# Patient Record
Sex: Female | Born: 1980 | Race: Black or African American | Hispanic: No | Marital: Single | Smoking: Never smoker
Health system: Southern US, Community
[De-identification: ages and names within clinical notes are randomized; demographics above are authoritative.]

## PROBLEM LIST (undated history)

## (undated) DIAGNOSIS — Z789 Other specified health status: Secondary | ICD-10-CM

## (undated) HISTORY — PX: MULTIPLE TOOTH EXTRACTIONS: SHX2053

## (undated) HISTORY — PX: CHOLECYSTECTOMY: SHX55

---

## 2018-12-07 DIAGNOSIS — Z113 Encounter for screening for infections with a predominantly sexual mode of transmission: Secondary | ICD-10-CM | POA: Diagnosis not present

## 2018-12-07 DIAGNOSIS — Z01419 Encounter for gynecological examination (general) (routine) without abnormal findings: Secondary | ICD-10-CM | POA: Diagnosis not present

## 2018-12-07 DIAGNOSIS — Z124 Encounter for screening for malignant neoplasm of cervix: Secondary | ICD-10-CM | POA: Diagnosis not present

## 2018-12-07 DIAGNOSIS — N898 Other specified noninflammatory disorders of vagina: Secondary | ICD-10-CM | POA: Diagnosis not present

## 2018-12-07 DIAGNOSIS — E669 Obesity, unspecified: Secondary | ICD-10-CM | POA: Diagnosis not present

## 2019-04-04 DIAGNOSIS — L219 Seborrheic dermatitis, unspecified: Secondary | ICD-10-CM | POA: Diagnosis not present

## 2019-05-29 DIAGNOSIS — M25572 Pain in left ankle and joints of left foot: Secondary | ICD-10-CM | POA: Diagnosis not present

## 2019-05-29 DIAGNOSIS — M79662 Pain in left lower leg: Secondary | ICD-10-CM | POA: Diagnosis not present

## 2019-06-11 DIAGNOSIS — K080 Exfoliation of teeth due to systemic causes: Secondary | ICD-10-CM | POA: Diagnosis not present

## 2019-07-25 DIAGNOSIS — Z01411 Encounter for gynecological examination (general) (routine) with abnormal findings: Secondary | ICD-10-CM | POA: Diagnosis not present

## 2019-07-25 DIAGNOSIS — N858 Other specified noninflammatory disorders of uterus: Secondary | ICD-10-CM | POA: Diagnosis not present

## 2019-08-26 DIAGNOSIS — N858 Other specified noninflammatory disorders of uterus: Secondary | ICD-10-CM | POA: Diagnosis not present

## 2019-08-26 DIAGNOSIS — D259 Leiomyoma of uterus, unspecified: Secondary | ICD-10-CM | POA: Diagnosis not present

## 2019-09-04 DIAGNOSIS — L700 Acne vulgaris: Secondary | ICD-10-CM | POA: Diagnosis not present

## 2019-09-04 DIAGNOSIS — L218 Other seborrheic dermatitis: Secondary | ICD-10-CM | POA: Diagnosis not present

## 2019-12-17 DIAGNOSIS — Z7189 Other specified counseling: Secondary | ICD-10-CM | POA: Diagnosis not present

## 2019-12-17 DIAGNOSIS — Z23 Encounter for immunization: Secondary | ICD-10-CM | POA: Diagnosis not present

## 2020-03-31 DIAGNOSIS — D259 Leiomyoma of uterus, unspecified: Secondary | ICD-10-CM | POA: Diagnosis not present

## 2020-04-06 DIAGNOSIS — Z789 Other specified health status: Secondary | ICD-10-CM | POA: Diagnosis not present

## 2020-04-06 DIAGNOSIS — D259 Leiomyoma of uterus, unspecified: Secondary | ICD-10-CM | POA: Diagnosis not present

## 2020-05-14 DIAGNOSIS — L658 Other specified nonscarring hair loss: Secondary | ICD-10-CM | POA: Diagnosis not present

## 2020-05-14 DIAGNOSIS — L219 Seborrheic dermatitis, unspecified: Secondary | ICD-10-CM | POA: Diagnosis not present

## 2020-06-23 DIAGNOSIS — R0789 Other chest pain: Secondary | ICD-10-CM | POA: Diagnosis not present

## 2020-07-02 DIAGNOSIS — L910 Hypertrophic scar: Secondary | ICD-10-CM | POA: Diagnosis not present

## 2020-07-02 DIAGNOSIS — L700 Acne vulgaris: Secondary | ICD-10-CM | POA: Diagnosis not present

## 2020-07-09 ENCOUNTER — Other Ambulatory Visit: Payer: Self-pay

## 2020-07-09 ENCOUNTER — Ambulatory Visit
Admission: RE | Admit: 2020-07-09 | Discharge: 2020-07-09 | Disposition: A | Discharge status: Home / Self Care | Payer: Federal, State, Local not specified - PPO | Source: Ambulatory Visit | Attending: Family Medicine | Admitting: Family Medicine

## 2020-07-09 ENCOUNTER — Other Ambulatory Visit: Payer: Self-pay | Admitting: Family Medicine

## 2020-07-09 DIAGNOSIS — R0789 Other chest pain: Secondary | ICD-10-CM

## 2020-07-15 NOTE — Pre-Procedure Instructions (Signed)
Surgical Instructions:    Your procedure is scheduled on Monday, May 23rd.  Report to [facility] Main Entrance "A" at 06:00 A.M., then check in with the Admitting office.  Call this number if you have any questions prior to, or have any problems the morning of surgery:  [phone]    Remember:  Do not eat after midnight the night before your surgery.  You may drink clear liquids until 05:00 AM the morning of your surgery.   Clear liquids allowed are: Water, Non-Citrus Juices (without pulp), Carbonated Beverages, Clear Tea, Black Coffee Only, and Gatorade.    Take these medicines the morning of surgery with A SIP OF WATER: NONE    As of today, STOP taking any Aspirin (unless otherwise instructed by your surgeon) Aleve, Naproxen, Ibuprofen, Motrin, Advil, Goody's, BC's, all herbal medications, fish oil, and all vitamins.              Special instructions:   [place]- Preparing For Surgery  Before surgery, you can play an important role. Because skin is not sterile, your skin needs to be as free of germs as possible. You can reduce the number of germs on your skin by washing with CHG (chlorahexidine gluconate) Soap before surgery.  CHG is an antiseptic cleaner which kills germs and bonds with the skin to continue killing germs even after washing.    Oral Hygiene is also important to reduce your risk of infection.  Remember - BRUSH YOUR TEETH THE MORNING OF SURGERY WITH YOUR REGULAR TOOTHPASTE  Please do not use if you have an allergy to CHG or antibacterial soaps. If your skin becomes reddened/irritated stop using the CHG.  Do not shave (including legs and underarms) for at least 48 hours prior to first CHG shower. It is OK to shave your face.  Please follow these instructions carefully.   1. Shower the NIGHT BEFORE SURGERY and the MORNING OF SURGERY  2. If you chose to wash your hair, wash your hair first as usual with your normal shampoo.  3. After you shampoo, rinse your  hair and body thoroughly to remove the shampoo.  4. Wash Face and genitals (private parts) with your normal soap.   5. Use CHG Soap as you would any other liquid soap. You can apply CHG directly to the skin and wash gently with a scrungie or a clean washcloth.   6. Apply the CHG Soap to your body ONLY FROM THE NECK DOWN.  Do not use on open wounds or open sores. Avoid contact with your eyes, ears, mouth and genitals (private parts). Wash Face and genitals (private parts)  with your normal soap.   7. Wash thoroughly, paying special attention to the area where your surgery will be performed.  8. Thoroughly rinse your body with warm water from the neck down.  9. DO NOT shower/wash with your normal soap after using and rinsing off the CHG Soap.  10. Pat yourself dry with a CLEAN TOWEL.  11. Wear CLEAN PAJAMAS to bed the night before surgery.  12. Place CLEAN SHEETS on your bed the night before your surgery.  13. DO NOT SLEEP WITH PETS.   Day of Surgery: SHOWER with CHG soap. Brush your teeth WITH YOUR REGULAR TOOTHPASTE. Wear Clean/Comfortable clothing the morning of surgery. Do not apply any deodorants/lotions.   Do not wear jewelry, make up, or nail polish. Do not shave 48 hours prior to surgery.   Do not bring valuables to the hospital. [facility] is not responsible for any belongings or valuables.  Do NOT Smoke (Tobacco/Vaping) or drink Alcohol 24 hours prior to your procedure.   If you use a CPAP at night, you may bring all equipment for your overnight stay.   Contacts, glasses, or dentures may not be worn into surgery, please bring cases for these belongings.   For patients admitted to the hospital, discharge time will be determined by your treatment team.  Patients discharged the day of surgery will not be allowed to drive home, and someone needs to stay with them for 24 hours.    Please read over the following fact sheets that you were given.

## 2020-07-16 ENCOUNTER — Other Ambulatory Visit (HOSPITAL_COMMUNITY)
Admission: RE | Admit: 2020-07-16 | Discharge: 2020-07-16 | Disposition: A | Discharge status: Home / Self Care | Payer: Federal, State, Local not specified - PPO | Source: Ambulatory Visit | Attending: Obstetrics and Gynecology | Admitting: Obstetrics and Gynecology

## 2020-07-16 ENCOUNTER — Encounter (HOSPITAL_COMMUNITY): Payer: Self-pay

## 2020-07-16 ENCOUNTER — Inpatient Hospital Stay (HOSPITAL_COMMUNITY)
Admission: RE | Admit: 2020-07-16 | Discharge: 2020-07-16 | Disposition: A | Discharge status: Home / Self Care | Payer: Self-pay | Source: Ambulatory Visit

## 2020-07-16 ENCOUNTER — Other Ambulatory Visit: Payer: Self-pay

## 2020-07-16 DIAGNOSIS — Z20822 Contact with and (suspected) exposure to covid-19: Secondary | ICD-10-CM | POA: Diagnosis not present

## 2020-07-16 DIAGNOSIS — Z01812 Encounter for preprocedural laboratory examination: Secondary | ICD-10-CM | POA: Insufficient documentation

## 2020-07-16 HISTORY — DX: Other specified health status: Z78.9

## 2020-07-16 LAB — SARS CORONAVIRUS 2 (TAT 6-24 HRS): SARS Coronavirus 2: NEGATIVE

## 2020-07-16 NOTE — Progress Notes (Addendum)
  SDW call completed because patient missed PAT appt today.  Verbal instructions given to patient.  [facility] will arrive 2.5 hours early to get her lab work completed before procedure.   PCP - denies Cardiologist - denies  Chest x-ray - denies EKG - denies Stress Test - denies ECHO - denies Cardiac Cath - denies  COVID TEST- pending   Anesthesia review: NO  Patient denies shortness of breath, fever, cough and chest pain at PAT appointment   All instructions explained to the patient, with a verbal understanding of the material. Patient agrees to go over the instructions while at home for a better understanding. Patient also instructed to self quarantine after being tested for COVID-19. The opportunity to ask questions was provided.

## 2020-07-16 NOTE — Pre-Procedure Instructions (Signed)
Surgical Instructions:    Your procedure is scheduled on Monday, May 23rd.  Report to [facility] Main Entrance "A" at 06:00 A.M., then check in with the Admitting office.  Call this number if you have any questions prior to, or have any problems the morning of surgery:  [phone]    Remember:  Do not eat or drink after midnight the night before your surgery.     Take these medicines the morning of surgery with A SIP OF WATER: NONE  As of today, STOP taking any Aspirin (unless otherwise instructed by your surgeon) Aleve, Naproxen, Ibuprofen, Motrin, Advil, Goody's, BC's, all herbal medications, fish oil, and all vitamins.              Special instructions:   [place]- Preparing For Surgery  Before surgery, you can play an important role. Because skin is not sterile, your skin needs to be as free of germs as possible. You can reduce the number of germs on your skin by washing with CHG (chlorahexidine gluconate) Soap before surgery.  CHG is an antiseptic cleaner which kills germs and bonds with the skin to continue killing germs even after washing.    Oral Hygiene is also important to reduce your risk of infection.  Remember - BRUSH YOUR TEETH THE MORNING OF SURGERY WITH YOUR REGULAR TOOTHPASTE  Please do not use if you have an allergy to CHG or antibacterial soaps. If your skin becomes reddened/irritated stop using the CHG.  Do not shave (including legs and underarms) for at least 48 hours prior to first CHG shower. It is OK to shave your face.  Please follow these instructions carefully.   1. Shower the NIGHT BEFORE SURGERY and the MORNING OF SURGERY  2. If you chose to wash your hair, wash your hair first as usual with your normal shampoo.  3. After you shampoo, rinse your hair and body thoroughly to remove the shampoo.  4. Wash Face and genitals (private parts) with your normal soap.   5. Use CHG Soap as you would any other liquid soap. You can apply CHG directly to  the skin and wash gently with a scrungie or a clean washcloth.   6. Apply the CHG Soap to your body ONLY FROM THE NECK DOWN.  Do not use on open wounds or open sores. Avoid contact with your eyes, ears, mouth and genitals (private parts). Wash Face and genitals (private parts)  with your normal soap.   7. Wash thoroughly, paying special attention to the area where your surgery will be performed.  8. Thoroughly rinse your body with warm water from the neck down.  9. DO NOT shower/wash with your normal soap after using and rinsing off the CHG Soap.  10. Pat yourself dry with a CLEAN TOWEL.  11. Wear CLEAN PAJAMAS to bed the night before surgery.  12. Place CLEAN SHEETS on your bed the night before your surgery.  13. DO NOT SLEEP WITH PETS.   Day of Surgery: SHOWER with CHG soap. Brush your teeth WITH YOUR REGULAR TOOTHPASTE. Wear Clean/Comfortable clothing the morning of surgery. Do not apply any deodorants/lotions.   Do not wear jewelry, make up, or nail polish. Do not shave 48 hours prior to surgery.   Do not bring valuables to the hospital. [facility] is not responsible for any belongings or valuables.  Do NOT Smoke (Tobacco/Vaping) or drink Alcohol 24 hours prior to your procedure.   If you use a CPAP at night, you  may bring all equipment for your overnight stay.   Contacts, glasses, or dentures may not be worn into surgery, please bring cases for these belongings.   For patients admitted to the hospital, discharge time will be determined by your treatment team.  Patients discharged the day of surgery will not be allowed to drive home, and someone needs to stay with them for 24 hours.    Please read over the following fact sheets that you were given.

## 2020-07-18 ENCOUNTER — Encounter (HOSPITAL_COMMUNITY): Payer: Self-pay | Admitting: Obstetrics and Gynecology

## 2020-07-18 NOTE — H&P (Signed)
Felicia Glass is an 40 y.o. female presenting for scheduled surgery. Still desires future fertility  Pertinent Gynecological History: Menses: flow is excessive with use of 6-7 pads or tampons on heaviest days Bleeding: dysfunctional uterine bleeding Contraception: abstinence DES exposure: denies Blood transfusions: none Sexually transmitted diseases: no past history Previous GYN Procedures: none  Last mammogram:too young Last pap: normal Date: 12/07/2018 OB History: G1, P1001 (2008 SVD x1, [facility])   Menstrual History: Menarche age: early teens Patient's last menstrual period was 07/16/2020.    Past Medical History:  Diagnosis Date  . Medical history non-contributory     Past Surgical History:  Procedure Laterality Date  . CHOLECYSTECTOMY    . MULTIPLE TOOTH EXTRACTIONS      History reviewed. No pertinent family history.  Social History:  reports that she has never smoked. She has never used smokeless tobacco. She reports current alcohol use. She reports previous drug use. Drug: Marijuana.  Allergies: No Known Allergies  No medications prior to admission.    Review of Systems  Constitutional: Negative for chills and fever.  Respiratory: Negative for shortness of breath.   Cardiovascular: Negative for chest pain, palpitations and leg swelling.  Gastrointestinal: Positive for abdominal distention. Negative for abdominal pain, nausea and vomiting.  Genitourinary: Positive for frequency and pelvic pain.  Neurological: Negative for dizziness, weakness and headaches.  Psychiatric/Behavioral: Negative for suicidal ideas.    Last menstrual period 07/16/2020. Physical Exam Gen NAD CV: CTAB, RRR Abd: Inspection/Palpation/Auscultation: no rebound or guarding and non-distended, soft, tenderness, normal bowel sounds, abdomen: incision laparoscopy, and uterus palpated 20 weeks; LLQ, TTP deep, palpable fundus vs fibroid Lap chole incision healed well. MSK: neg calf  edema/Homans BL Psych/Neuro: WNL  No results found for this or any previous visit (from the past 24 hour(s)).  No results found.  Assessment/Plan: This is a 40yo G1P1001, currently abstinent, who presents for surgical management of her AUB-HMB-L and preservation of fertility via abdominal myomectomy and cystoscopy. Risks of procedure (abd myomectomy/cysto) include infection of the uterus, pelvic organs, or skin, inadvertent injury to internal organs, such as bowel or bladder. If there is major injury, extensive surgery may be required. If injury is minor, it may be treated with relative ease. Discussed possibility of excessive blood loss and transfusion. Patient accepts the possibility of blood transfusion, if necessary. Bowel and/or bladder injury may require prolonged inpatient stay and possible colostomy, Foley catheter, etc, as deemed fit by other surgeon. Patient understands and agrees to move forward  Felicia Glass 07/18/2020, 4:44 PM

## 2020-07-20 ENCOUNTER — Inpatient Hospital Stay (HOSPITAL_COMMUNITY): Payer: Federal, State, Local not specified - PPO | Admitting: Anesthesiology

## 2020-07-20 ENCOUNTER — Inpatient Hospital Stay (HOSPITAL_COMMUNITY)
Admission: RE | Admit: 2020-07-20 | Discharge: 2020-07-22 | DRG: 743 | Disposition: A | Discharge status: Home / Self Care | Payer: Federal, State, Local not specified - PPO | Attending: Obstetrics and Gynecology | Admitting: Obstetrics and Gynecology

## 2020-07-20 ENCOUNTER — Encounter (HOSPITAL_COMMUNITY): Payer: Self-pay | Admitting: Obstetrics and Gynecology

## 2020-07-20 ENCOUNTER — Encounter (HOSPITAL_COMMUNITY)
Admission: RE | Disposition: A | Discharge status: Home / Self Care | Payer: Self-pay | Source: Home / Self Care | Attending: Obstetrics and Gynecology

## 2020-07-20 DIAGNOSIS — R350 Frequency of micturition: Secondary | ICD-10-CM | POA: Diagnosis present

## 2020-07-20 DIAGNOSIS — D252 Subserosal leiomyoma of uterus: Principal | ICD-10-CM | POA: Diagnosis present

## 2020-07-20 DIAGNOSIS — Z9049 Acquired absence of other specified parts of digestive tract: Secondary | ICD-10-CM

## 2020-07-20 DIAGNOSIS — R12 Heartburn: Secondary | ICD-10-CM | POA: Diagnosis not present

## 2020-07-20 DIAGNOSIS — R3915 Urgency of urination: Secondary | ICD-10-CM | POA: Diagnosis present

## 2020-07-20 DIAGNOSIS — N946 Dysmenorrhea, unspecified: Secondary | ICD-10-CM | POA: Diagnosis not present

## 2020-07-20 DIAGNOSIS — D251 Intramural leiomyoma of uterus: Secondary | ICD-10-CM | POA: Diagnosis not present

## 2020-07-20 DIAGNOSIS — D259 Leiomyoma of uterus, unspecified: Secondary | ICD-10-CM | POA: Diagnosis present

## 2020-07-20 DIAGNOSIS — Z3169 Encounter for other general counseling and advice on procreation: Secondary | ICD-10-CM | POA: Diagnosis not present

## 2020-07-20 HISTORY — PX: MYOMECTOMY: SHX85

## 2020-07-20 LAB — CBC WITH DIFFERENTIAL/PLATELET
Abs Immature Granulocytes: 0.01 10*3/uL (ref 0.00–0.07)
Basophils Absolute: 0 10*3/uL (ref 0.0–0.1)
Basophils Relative: 1 %
Eosinophils Absolute: 0.2 10*3/uL (ref 0.0–0.5)
Eosinophils Relative: 4 %
HCT: 32.7 % — ABNORMAL LOW (ref 36.0–46.0)
Hemoglobin: 10.1 g/dL — ABNORMAL LOW (ref 12.0–15.0)
Immature Granulocytes: 0 %
Lymphocytes Relative: 49 %
Lymphs Abs: 1.9 10*3/uL (ref 0.7–4.0)
MCH: 29 pg (ref 26.0–34.0)
MCHC: 30.9 g/dL (ref 30.0–36.0)
MCV: 94 fL (ref 80.0–100.0)
Monocytes Absolute: 0.5 10*3/uL (ref 0.1–1.0)
Monocytes Relative: 12 %
Neutro Abs: 1.4 10*3/uL — ABNORMAL LOW (ref 1.7–7.7)
Neutrophils Relative %: 34 %
Platelets: 175 10*3/uL (ref 150–400)
RBC: 3.48 MIL/uL — ABNORMAL LOW (ref 3.87–5.11)
RDW: 16.4 % — ABNORMAL HIGH (ref 11.5–15.5)
WBC: 4 10*3/uL (ref 4.0–10.5)
nRBC: 0 % (ref 0.0–0.2)

## 2020-07-20 LAB — RPR: RPR Ser Ql: NONREACTIVE

## 2020-07-20 LAB — PREPARE RBC (CROSSMATCH)

## 2020-07-20 LAB — POCT PREGNANCY, URINE: Preg Test, Ur: NEGATIVE

## 2020-07-20 LAB — ABO/RH: ABO/RH(D): O POS

## 2020-07-20 SURGERY — MYOMECTOMY, ABDOMINAL APPROACH
Anesthesia: General | Site: Abdomen

## 2020-07-20 MED ORDER — ROCURONIUM BROMIDE 10 MG/ML (PF) SYRINGE
PREFILLED_SYRINGE | INTRAVENOUS | Status: AC
  Filled 2020-07-20: qty 10

## 2020-07-20 MED ORDER — ONDANSETRON HCL 4 MG/2ML IJ SOLN: 4.0000 mg | Freq: Once | INTRAMUSCULAR | Status: DC | PRN

## 2020-07-20 MED ORDER — ONDANSETRON HCL 4 MG PO TABS: 4.0000 mg | ORAL_TABLET | Freq: Four times a day (QID) | ORAL | Status: DC | PRN

## 2020-07-20 MED ORDER — IBUPROFEN 600 MG PO TABS
600.0000 mg | ORAL_TABLET | Freq: Four times a day (QID) | ORAL | Status: DC
  Administered 2020-07-21 – 2020-07-22 (×4): 600 mg via ORAL
  Filled 2020-07-20 (×5): qty 1

## 2020-07-20 MED ORDER — MIDAZOLAM HCL 5 MG/5ML IJ SOLN
INTRAMUSCULAR | Status: DC | PRN
  Administered 2020-07-20: 2 mg via INTRAVENOUS

## 2020-07-20 MED ORDER — LACTATED RINGERS IV SOLN: INTRAVENOUS | Status: DC

## 2020-07-20 MED ORDER — SODIUM CHLORIDE 0.9 % IR SOLN: Status: DC | PRN

## 2020-07-20 MED ORDER — LIDOCAINE 2% (20 MG/ML) 5 ML SYRINGE
INTRAMUSCULAR | Status: AC
  Filled 2020-07-20: qty 5

## 2020-07-20 MED ORDER — DOCUSATE SODIUM 100 MG PO CAPS
100.0000 mg | ORAL_CAPSULE | Freq: Two times a day (BID) | ORAL | Status: DC
  Administered 2020-07-20 – 2020-07-22 (×5): 100 mg via ORAL
  Filled 2020-07-20 (×5): qty 1

## 2020-07-20 MED ORDER — FENTANYL CITRATE (PF) 100 MCG/2ML IJ SOLN
25.0000 ug | INTRAMUSCULAR | Status: DC | PRN
  Administered 2020-07-20: 50 ug via INTRAVENOUS

## 2020-07-20 MED ORDER — OXYCODONE HCL 5 MG/5ML PO SOLN: 5.0000 mg | Freq: Once | ORAL | Status: DC | PRN

## 2020-07-20 MED ORDER — OXYCODONE HCL 5 MG PO TABS: 5.0000 mg | ORAL_TABLET | Freq: Once | ORAL | Status: DC | PRN

## 2020-07-20 MED ORDER — PROPOFOL 10 MG/ML IV BOLUS
INTRAVENOUS | Status: DC | PRN
  Administered 2020-07-20: 150 mg via INTRAVENOUS

## 2020-07-20 MED ORDER — SODIUM CHLORIDE (PF) 0.9 % IJ SOLN
INTRAMUSCULAR | Status: AC
  Filled 2020-07-20: qty 100

## 2020-07-20 MED ORDER — VASOPRESSIN 20 UNIT/ML IV SOLN
INTRAVENOUS | Status: DC | PRN
  Administered 2020-07-20: 13 mL via INTRAMUSCULAR

## 2020-07-20 MED ORDER — ORAL CARE MOUTH RINSE: 15.0000 mL | Freq: Once | OROMUCOSAL | Status: DC

## 2020-07-20 MED ORDER — FENTANYL CITRATE (PF) 100 MCG/2ML IJ SOLN
INTRAMUSCULAR | Status: AC
  Filled 2020-07-20: qty 2

## 2020-07-20 MED ORDER — POLYETHYLENE GLYCOL 3350 17 G PO PACK: 17.0000 g | PACK | Freq: Every day | ORAL | Status: DC | PRN

## 2020-07-20 MED ORDER — LIDOCAINE 2% (20 MG/ML) 5 ML SYRINGE
INTRAMUSCULAR | Status: DC | PRN
  Administered 2020-07-20: 60 mg via INTRAVENOUS

## 2020-07-20 MED ORDER — CHLORHEXIDINE GLUCONATE 0.12 % MT SOLN
15.0000 mL | Freq: Once | OROMUCOSAL | Status: DC
  Filled 2020-07-20: qty 15

## 2020-07-20 MED ORDER — PHENYLEPHRINE 40 MCG/ML (10ML) SYRINGE FOR IV PUSH (FOR BLOOD PRESSURE SUPPORT)
PREFILLED_SYRINGE | INTRAVENOUS | Status: AC
  Filled 2020-07-20: qty 10

## 2020-07-20 MED ORDER — BUPIVACAINE HCL (PF) 0.25 % IJ SOLN
INTRAMUSCULAR | Status: AC
  Filled 2020-07-20: qty 30

## 2020-07-20 MED ORDER — 0.9 % SODIUM CHLORIDE (POUR BTL) OPTIME
TOPICAL | Status: DC | PRN
  Administered 2020-07-20: 2000 mL

## 2020-07-20 MED ORDER — FENTANYL CITRATE (PF) 100 MCG/2ML IJ SOLN
INTRAMUSCULAR | Status: DC | PRN
  Administered 2020-07-20: 100 ug via INTRAVENOUS

## 2020-07-20 MED ORDER — ONDANSETRON HCL 4 MG/2ML IJ SOLN: 4.0000 mg | Freq: Four times a day (QID) | INTRAMUSCULAR | Status: DC | PRN

## 2020-07-20 MED ORDER — PROPOFOL 10 MG/ML IV BOLUS
INTRAVENOUS | Status: AC
  Filled 2020-07-20: qty 40

## 2020-07-20 MED ORDER — SODIUM CHLORIDE 0.9% IV SOLUTION: Freq: Once | INTRAVENOUS | Status: DC

## 2020-07-20 MED ORDER — NON FORMULARY
Status: DC | PRN
  Administered 2020-07-20: 1 mL

## 2020-07-20 MED ORDER — ONDANSETRON HCL 4 MG/2ML IJ SOLN
INTRAMUSCULAR | Status: DC | PRN
  Administered 2020-07-20: 4 mg via INTRAVENOUS

## 2020-07-20 MED ORDER — ACETAMINOPHEN 500 MG PO TABS
1000.0000 mg | ORAL_TABLET | Freq: Four times a day (QID) | ORAL | Status: DC
  Administered 2020-07-20 – 2020-07-22 (×8): 1000 mg via ORAL
  Filled 2020-07-20 (×8): qty 2

## 2020-07-20 MED ORDER — CEFAZOLIN SODIUM-DEXTROSE 2-4 GM/100ML-% IV SOLN
2.0000 g | INTRAVENOUS | Status: AC
  Administered 2020-07-20: 2 g via INTRAVENOUS
  Filled 2020-07-20: qty 100

## 2020-07-20 MED ORDER — DEXAMETHASONE SODIUM PHOSPHATE 10 MG/ML IJ SOLN
INTRAMUSCULAR | Status: AC
  Filled 2020-07-20: qty 1

## 2020-07-20 MED ORDER — SUGAMMADEX SODIUM 200 MG/2ML IV SOLN
INTRAVENOUS | Status: DC | PRN
  Administered 2020-07-20: 200 mg via INTRAVENOUS

## 2020-07-20 MED ORDER — MIDAZOLAM HCL 2 MG/2ML IJ SOLN
INTRAMUSCULAR | Status: AC
  Filled 2020-07-20: qty 2

## 2020-07-20 MED ORDER — VASOPRESSIN 20 UNIT/ML IV SOLN
INTRAVENOUS | Status: AC
  Filled 2020-07-20: qty 1

## 2020-07-20 MED ORDER — KETOROLAC TROMETHAMINE 30 MG/ML IJ SOLN
30.0000 mg | Freq: Four times a day (QID) | INTRAMUSCULAR | Status: AC
  Administered 2020-07-20 – 2020-07-21 (×4): 30 mg via INTRAVENOUS
  Filled 2020-07-20 (×4): qty 1

## 2020-07-20 MED ORDER — BUPIVACAINE HCL 0.25 % IJ SOLN
INTRAMUSCULAR | Status: DC | PRN
  Administered 2020-07-20: 10 mL

## 2020-07-20 MED ORDER — ONDANSETRON HCL 4 MG/2ML IJ SOLN
INTRAMUSCULAR | Status: AC
  Filled 2020-07-20: qty 2

## 2020-07-20 MED ORDER — PHENYLEPHRINE 40 MCG/ML (10ML) SYRINGE FOR IV PUSH (FOR BLOOD PRESSURE SUPPORT)
PREFILLED_SYRINGE | INTRAVENOUS | Status: DC | PRN
  Administered 2020-07-20 (×2): 80 ug via INTRAVENOUS

## 2020-07-20 MED ORDER — OXYCODONE HCL 5 MG PO TABS
5.0000 mg | ORAL_TABLET | ORAL | Status: DC | PRN
  Administered 2020-07-20 – 2020-07-22 (×4): 10 mg via ORAL
  Filled 2020-07-20 (×4): qty 2

## 2020-07-20 MED ORDER — ACETAMINOPHEN 500 MG PO TABS
1000.0000 mg | ORAL_TABLET | ORAL | Status: AC
  Administered 2020-07-20: 1000 mg via ORAL
  Filled 2020-07-20: qty 2

## 2020-07-20 MED ORDER — SIMETHICONE 80 MG PO CHEW
80.0000 mg | CHEWABLE_TABLET | Freq: Four times a day (QID) | ORAL | Status: DC | PRN
  Administered 2020-07-20 – 2020-07-22 (×2): 80 mg via ORAL
  Filled 2020-07-20 (×2): qty 1

## 2020-07-20 MED ORDER — POVIDONE-IODINE 10 % EX SWAB: 2.0000 "application " | Freq: Once | CUTANEOUS | Status: DC

## 2020-07-20 MED ORDER — DEXAMETHASONE SODIUM PHOSPHATE 4 MG/ML IJ SOLN
INTRAMUSCULAR | Status: DC | PRN
  Administered 2020-07-20: 10 mg via INTRAVENOUS

## 2020-07-20 MED ORDER — ROCURONIUM BROMIDE 10 MG/ML (PF) SYRINGE
PREFILLED_SYRINGE | INTRAVENOUS | Status: DC | PRN
  Administered 2020-07-20: 10 mg via INTRAVENOUS
  Administered 2020-07-20: 60 mg via INTRAVENOUS

## 2020-07-20 MED ORDER — FENTANYL CITRATE (PF) 250 MCG/5ML IJ SOLN
INTRAMUSCULAR | Status: AC
  Filled 2020-07-20: qty 5

## 2020-07-20 SURGICAL SUPPLY — 47 items
BARRIER ADHS 3X4 INTERCEED (GAUZE/BANDAGES/DRESSINGS) ×3 IMPLANT
CANISTER SUCT 3000ML PPV (MISCELLANEOUS) ×3 IMPLANT
COVER WAND RF STERILE (DRAPES) ×3 IMPLANT
DECANTER SPIKE VIAL GLASS SM (MISCELLANEOUS) ×3 IMPLANT
DRAPE CESAREAN BIRTH W POUCH (DRAPES) IMPLANT
DRSG OPSITE POSTOP 4X10 (GAUZE/BANDAGES/DRESSINGS) ×3 IMPLANT
DURAPREP 26ML APPLICATOR (WOUND CARE) ×3 IMPLANT
FILTER STRAW FLUID ASPIR (MISCELLANEOUS) IMPLANT
GAUZE 4X4 16PLY RFD (DISPOSABLE) ×3 IMPLANT
GLOVE BIO SURGEON STRL SZ 6 (GLOVE) ×3 IMPLANT
GLOVE SURG UNDER POLY LF SZ6.5 (GLOVE) ×3 IMPLANT
GLOVE SURG UNDER POLY LF SZ7 (GLOVE) ×3 IMPLANT
GOWN STRL REUS W/ TWL LRG LVL3 (GOWN DISPOSABLE) ×6 IMPLANT
GOWN STRL REUS W/TWL LRG LVL3 (GOWN DISPOSABLE) ×3
KIT TURNOVER KIT B (KITS) ×3 IMPLANT
MANIPULATOR UTERINE 7CM CLEARV (MISCELLANEOUS) IMPLANT
NEEDLE HYPO 22GX1.5 SAFETY (NEEDLE) ×3 IMPLANT
NEEDLE SPNL 22GX3.5 QUINCKE BK (NEEDLE) ×3 IMPLANT
NS IRRIG 1000ML POUR BTL (IV SOLUTION) ×3 IMPLANT
PACK ABDOMINAL GYN (CUSTOM PROCEDURE TRAY) ×3 IMPLANT
PAD OB MATERNITY 4.3X12.25 (PERSONAL CARE ITEMS) ×3 IMPLANT
PENCIL SMOKE EVACUATOR (MISCELLANEOUS) ×3 IMPLANT
RTRCTR C-SECT PINK 25CM LRG (MISCELLANEOUS) ×3 IMPLANT
SEPRAFILM MEMBRANE 5X6 (MISCELLANEOUS) IMPLANT
SET CYSTO W/LG BORE CLAMP LF (SET/KITS/TRAYS/PACK) ×3 IMPLANT
SHEET LAVH (DRAPES) IMPLANT
SPONGE LAP 18X18 RF (DISPOSABLE) ×3 IMPLANT
SURGILUBE 2OZ TUBE FLIPTOP (MISCELLANEOUS) ×3 IMPLANT
SUT MON AB 2-0 SH 27 (SUTURE) ×2
SUT MON AB 2-0 SH27 (SUTURE) ×4 IMPLANT
SUT PDS AB 0 CTX 60 (SUTURE) ×3 IMPLANT
SUT PLAIN 2 0 (SUTURE)
SUT PLAIN 2 0 XLH (SUTURE) IMPLANT
SUT PLAIN ABS 2-0 CT1 27XMFL (SUTURE) IMPLANT
SUT VIC AB 0 CT1 18XCR BRD8 (SUTURE) ×2 IMPLANT
SUT VIC AB 0 CT1 27 (SUTURE) ×2
SUT VIC AB 0 CT1 27XBRD ANBCTR (SUTURE) ×4 IMPLANT
SUT VIC AB 0 CT1 8-18 (SUTURE) ×1
SUT VIC AB 2-0 CT1 27 (SUTURE) ×1
SUT VIC AB 2-0 CT1 TAPERPNT 27 (SUTURE) ×2 IMPLANT
SUT VIC AB 4-0 KS 27 (SUTURE) ×3 IMPLANT
SUT VIC AB 4-0 SH 27 (SUTURE)
SUT VIC AB 4-0 SH 27XBRD (SUTURE) IMPLANT
SYR 3ML LL SCALE MARK (SYRINGE) ×3 IMPLANT
SYR CONTROL 10ML LL (SYRINGE) ×3 IMPLANT
TOWEL GREEN STERILE FF (TOWEL DISPOSABLE) ×6 IMPLANT
TRAY FOLEY W/BAG SLVR 14FR (SET/KITS/TRAYS/PACK) ×3 IMPLANT

## 2020-07-20 NOTE — Interval H&P Note (Signed)
History and Physical Interval Note:  07/20/2020 7:51 AM  Felicia Glass  has presented today for surgery, with the diagnosis of symptomatic uterine fibriods.  The various methods of treatment have been discussed with the patient and family. After consideration of risks, benefits and other options for treatment, the patient has consented to  Procedure(s): ABDOMINAL MYOMECTOMY (N/A) CYSTOSCOPY (N/A) as a surgical intervention.  The patient's history has been reviewed, patient examined, no change in status, stable for surgery.  I have reviewed the patient's chart and labs.  Questions were answered to the patient's satisfaction.    Patient is ready to proceed with procedure as stated. Is currently on cycle. Reviewed postoperative expectations and goals of procedure - removal of fibroids, maintaining future fertility. Is amenable to hysterectomy if unable to control bleeding and is necessary to save life. All questions answered.   Abdominal exam unchanged since preoperative visit  [place]

## 2020-07-20 NOTE — Plan of Care (Signed)
  Problem: Education: Goal: Knowledge of General Education information will improve Description Including pain rating scale, medication(s)/side effects and non-pharmacologic comfort measures Outcome: Progressing   

## 2020-07-20 NOTE — Anesthesia Procedure Notes (Signed)
Procedure Name: Intubation Date/Time: 07/20/2020 8:11 AM Performed by: Lieutenant Diego, CRNA Pre-anesthesia Checklist: Patient identified, Emergency Drugs available, Suction available and Patient being monitored Patient Re-evaluated:Patient Re-evaluated prior to induction Oxygen Delivery Method: Circle system utilized Preoxygenation: Pre-oxygenation with 100% oxygen Induction Type: IV induction Ventilation: Mask ventilation without difficulty Laryngoscope Size: 3 Grade View: Grade I Tube type: Oral Tube size: 7.0 mm Number of attempts: 1 Airway Equipment and Method: Stylet and Oral airway Placement Confirmation: ETT inserted through vocal cords under direct vision,  positive ETCO2 and breath sounds checked- equal and bilateral Secured at: 21 cm Tube secured with: Tape Dental Injury: Teeth and Oropharynx as per pre-operative assessment

## 2020-07-20 NOTE — Anesthesia Postprocedure Evaluation (Signed)
Anesthesia Post Note  Patient: Felicia Glass  Procedure(s) Performed: ABDOMINAL MYOMECTOMY (N/A Abdomen)     Patient location during evaluation: PACU Anesthesia Type: General Level of consciousness: awake and alert Pain management: pain level controlled Vital Signs Assessment: post-procedure vital signs reviewed and stable Respiratory status: spontaneous breathing, nonlabored ventilation, respiratory function stable and patient connected to nasal cannula oxygen Cardiovascular status: blood pressure returned to baseline and stable Postop Assessment: no apparent nausea or vomiting Anesthetic complications: no   No complications documented.  Last Vitals:  Vitals:   07/20/20 1142 07/20/20 1616  BP: 106/82 119/67  Pulse: 71 80  Resp: 15 16  Temp: 36.9 C 37.1 C  SpO2: 100% 100%    Last Pain:  Vitals:   07/20/20 1616  TempSrc: Oral  PainSc:                  Jasyn Mey COKER

## 2020-07-20 NOTE — Op Note (Signed)
07/20/20 Surgeon: Eula Flax, MD Assist: Carlynn Purl, DO Procedure: Abdominal myomectomy Indication: Symptomatic uterine fibroids with desired future fertility Anesthesia: General by Dr Linna Caprice EBL: 200cc IVF: 1L LR UOP: 100cc clear yellow urine via Foley  Indications: Ms. Falotico is a 40 year-old G1 P1-0-0-1 who presents today for surgical management of symptomatic fibroids with desires future fertility.  In-office imaging via transvaginal ultrasound shows approximately 8 cm likely intramural fibroid on anterior fundal surface as well as 4cm posterior subserosal fundal fibroid.  Intraoperative findings also notable for two small 1cm subserosal fibroids near right cornua. Patient endorses urinary frequency and urgency as well as dysmenorrhea (heavy flow and cramping).  Declining medical or expectant management and desires surgical management in the form of abdominal myomectomy.  Consent: Risks of abdominal myomectomy include infection of the uterus, pelvic organs, or skin, inadvertent injury to internal organs, such as bowel or bladder. If there is major injury, extensive surgery may be required. If injury is minor, it may be treated with relative ease. Discussed possibility of excessive blood loss and transfusion. Patient accepts the possibility of blood transfusion, if necessary. Bowel and/or bladder injury may require prolonged inpatient stay and possible colostomy, Foley catheter, etc, as deemed fit by other surgeon. Patient understands and agrees to move forward with surgery Reviewed size of fibroid at this time and that safest approach would be abdominal myomectomy with mini-lap. Also reviewed that, given size and pending intraop evaluation, patient will require C-section for future deliveries. Patient does understand this and wishes to continue   Operative technique: Patient was taken to the operating room where appropriate timeout procedure was held.  Patient was placed in  dorsolithotomy position as possible cystoscopy had been discussed and consented for given size of uterine fibroid.  2 g Ancef were given preoperatively.  Patient was placed in dorsolithotomy with all incisions padded appropriately.  Pfannenstiel incision was created using scalpel followed by Bovie cautery until fascia was reached.  Fascial incision was extended using Mayo scissors laterally.  Edges were then reflected off of rectus bellies superior and inferiorly using Kocher clamps.  Peritoneal cavity was entered using Kelly clamp x2 and Metzenbaum scissors.  At this time, Surgeon's hand was placed into pelvis where findings were noted as above.  Medium-size Alexis self-retaining retractor was placed without issue.  Lap sponges x2 were placed for bowel retraction.  7 cc of 20 units of Pitressin in100 mL of normal saline was instilled using spinal needle immediately subserosally in horizontal fashion along top of fibroid.  Sharp incision followed by Bovie cautery was used until whorled appearance of fibroid was noted.  Using curved hemostats x2, uterine serosa was deflected off of fibroid surface.  Gentle traction was placed until entirety of fibroid was exposed.  Bovie cautery used as needed for excellent hemostasis.  Specimen was then passed off of table, no large feeding vessel noted.  Defect examined at this time, appeared to have no endometrial cavity impingement. This process Defect was closed with 3 layers of 0 Vicryl in both figure-of-eight and running fashions.  Serosal closure was accomplished with 2-0 Monocryl in a baseball stitch. Similar process was used to excise 4cm calcified fibroid noted subserosally on posterior left fundus extending into meso-ovarium and small fibroids noted immediately inferior to right cornua.  Irrigation carried out without issue of abdominal cavity.  Bilateral fallopian tubes and ovaries appeared within normal limits.  Seprafilm placed over repair sites. Alexis retractor  and lap sponges removed without issue.  Fascia was  then closed using 0 Vicryl x2, each beginning at separate apices and meeting in middle being tied together.  Irrigation once again performed prior to subcutaneous closure.  Subcutaneous layer closed with 0 plain on CTX needle in running fashion.  Skin closed in subcuticular fashion using 3-0 Vicryl on a Keith needle and being reinforced with Dermabond.  The patient tolerated procedure well.  All counts were correct x2 throughout procedure.  She was woken up in stable fashion and was taken into PACU without any issue.

## 2020-07-20 NOTE — Interval H&P Note (Deleted)
History and Physical Interval Note:  07/20/2020 7:49 AM  Felicia Glass  has presented today for surgery, with the diagnosis of symptomatic uterine fibriods.  The various methods of treatment have been discussed with the patient and family. After consideration of risks, benefits and other options for treatment, the patient has consented to  Procedure(s): ABDOMINAL MYOMECTOMY (N/A) CYSTOSCOPY (N/A) as a surgical intervention.  The patient's history has been reviewed, patient examined, no change in status, stable for surgery.  I have reviewed the patient's chart and labs.  Questions were answered to the patient's satisfaction.    Patient is ready to proceed with procedure as stated. Is currently on cycle. Reviewed postoperative expectations and goals of procedure - removal of fibroids, maintaining future fertility. Is amenable to hysterectomy if unable to control bleeding and is necessary to save life. All questions answered.  Abdominal e   Felicia Glass

## 2020-07-20 NOTE — Anesthesia Preprocedure Evaluation (Addendum)
Anesthesia Evaluation  Patient identified by MRN, date of birth, ID band Patient awake    Reviewed: Allergy & Precautions, NPO status , Patient's Chart, lab work & pertinent test results  Airway Mallampati: II  TM Distance: >3 FB Neck ROM: Full    Dental  (+) Dental Advisory Given, Missing,    Pulmonary    breath sounds clear to auscultation       Cardiovascular  Rhythm:Regular Rate:Normal     Neuro/Psych    GI/Hepatic   Endo/Other    Renal/GU      Musculoskeletal   Abdominal   Peds  Hematology   Anesthesia Other Findings   Reproductive/Obstetrics                            Anesthesia Physical Anesthesia Plan  ASA: II  Anesthesia Plan: General   Post-op Pain Management:    Induction: Intravenous  PONV Risk Score and Plan: Ondansetron and Dexamethasone  Airway Management Planned: Oral ETT  Additional Equipment:   Intra-op Plan:   Post-operative Plan: Extubation in OR  Informed Consent: I have reviewed the patients History and Physical, chart, labs and discussed the procedure including the risks, benefits and alternatives for the proposed anesthesia with the patient or authorized representative who has indicated his/her understanding and acceptance.     Dental advisory given  Plan Discussed with: CRNA and Anesthesiologist  Anesthesia Plan Comments:         Anesthesia Quick Evaluation

## 2020-07-20 NOTE — Transfer of Care (Signed)
Immediate Anesthesia Transfer of Care Note  Patient: Felicia Glass  Procedure(s) Performed: ABDOMINAL MYOMECTOMY (N/A Abdomen)  Patient Location: PACU  Anesthesia Type:General  Level of Consciousness: awake and alert   Airway & Oxygen Therapy: Patient Spontanous Breathing and Patient connected to face mask oxygen  Post-op Assessment: Report given to RN and Post -op Vital signs reviewed and stable  Post vital signs: Reviewed and stable  Last Vitals:  Vitals Value Taken Time  BP    Temp    Pulse 64 07/20/20 0959  Resp 16 07/20/20 0959  SpO2 100 % 07/20/20 0959  Vitals shown include unvalidated device data.  Last Pain:  Vitals:   07/20/20 0705  TempSrc:   PainSc: 0-No pain      Patients Stated Pain Goal: 5 (25/36/64 4034)  Complications: No complications documented.

## 2020-07-21 ENCOUNTER — Encounter (HOSPITAL_COMMUNITY): Payer: Self-pay | Admitting: Obstetrics and Gynecology

## 2020-07-21 LAB — CBC
HCT: 30.3 % — ABNORMAL LOW (ref 36.0–46.0)
Hemoglobin: 10 g/dL — ABNORMAL LOW (ref 12.0–15.0)
MCH: 29.4 pg (ref 26.0–34.0)
MCHC: 33 g/dL (ref 30.0–36.0)
MCV: 89.1 fL (ref 80.0–100.0)
Platelets: 162 10*3/uL (ref 150–400)
RBC: 3.4 MIL/uL — ABNORMAL LOW (ref 3.87–5.11)
RDW: 16.1 % — ABNORMAL HIGH (ref 11.5–15.5)
WBC: 10.1 10*3/uL (ref 4.0–10.5)
nRBC: 0 % (ref 0.0–0.2)

## 2020-07-21 LAB — SURGICAL PATHOLOGY

## 2020-07-21 MED ORDER — PANTOPRAZOLE SODIUM 40 MG IV SOLR
40.0000 mg | INTRAVENOUS | Status: DC
  Administered 2020-07-21: 40 mg via INTRAVENOUS
  Filled 2020-07-21: qty 40

## 2020-07-21 MED ORDER — PANTOPRAZOLE SODIUM 40 MG PO TBEC
40.0000 mg | DELAYED_RELEASE_TABLET | Freq: Every day | ORAL | Status: DC
  Administered 2020-07-22: 40 mg via ORAL
  Filled 2020-07-21: qty 1

## 2020-07-21 NOTE — Progress Notes (Signed)
POD #1  Subjective:  No acute events overnight.  Pt denies problems with po intake.  She denies nausea or vomiting.  Pain is well controlled.  She has had flatus. She has not had bowel movement. Lochia Minimal. Foley in place, occ heartburn. Reviewed intraoperative course, overall uncomplicated procedure  Objective: Blood pressure 112/68, pulse 80, temperature 98 F (36.7 C), temperature source Oral, resp. rate 18, height 5\' 11"  (1.803 m), weight 99.8 kg, last menstrual period 07/16/2020, SpO2 100 %.  Physical Exam:  General: alert, cooperative and no distress Lochia:normal flow Chest: CTAB Heart: RRR no m/r/g Abdomen: +BS, soft, nontender Uterine Fundus: non-palpable c/w fibroid removal. Honeycomb dressing intact, neg drainage Extremities: neg edema, neg calf TTP BL, neg Homans BL  Recent Labs    07/20/20 0610 07/21/20 0109  HGB 10.1* 10.0*  HCT 32.7* 30.3*    Assessment/Plan:  ASSESSMENT: Felicia Glass is a 40 y.o. G1P1001 s/p abdominal myomectomy for symptomatic uterine fibroids/desired future fertility. PMHx s/f lap chole.   -Continue PO pain meds -D/C Foley and pend spontaneous void -Encourage ambulation and incentive spirometry -Anticipate DC home POD2-3    LOS: 1 day

## 2020-07-22 MED ORDER — SIMETHICONE 80 MG PO CHEW: 80.0000 mg | CHEWABLE_TABLET | Freq: Four times a day (QID) | ORAL | 0 refills | Status: AC | PRN

## 2020-07-22 MED ORDER — POLYETHYLENE GLYCOL 3350 17 G PO PACK: 17.0000 g | PACK | Freq: Every day | ORAL | 0 refills | Status: AC | PRN

## 2020-07-22 MED ORDER — IBUPROFEN 800 MG PO TABS: 800.0000 mg | ORAL_TABLET | Freq: Three times a day (TID) | ORAL | 1 refills | Status: AC

## 2020-07-22 MED ORDER — OXYCODONE HCL 5 MG PO TABS: 5.0000 mg | ORAL_TABLET | Freq: Four times a day (QID) | ORAL | 0 refills | Status: AC | PRN

## 2020-07-22 MED ORDER — ACETAMINOPHEN 500 MG PO TABS: 1000.0000 mg | ORAL_TABLET | Freq: Three times a day (TID) | ORAL | 0 refills | Status: AC

## 2020-07-22 NOTE — Discharge Summary (Signed)
Physician Discharge Summary  Patient ID: Felicia Glass MRN: [id number] DOB/AGE: 38/11/1980 38 y.o.  Admit date: 07/20/2020 Discharge date: 07/22/2020  Admission Diagnoses:  Discharge Diagnoses:  Active Problems:   Uterine fibroid   Discharged Condition: good  Hospital Course: Admitted for scheduled abdominal myomectomy for symptomatic uterine fibroids and desired future fertility. Uncomplicated procedure, please see operative note for full details. By POD#2, ambulating, tolerating PO, voiding, pain controlled on PO meds. Discharged home in stable fashion with routine precautions   Consults: None  Discharge Exam: Blood pressure 108/82, pulse 74, temperature 97.8 F (36.6 C), temperature source Oral, resp. rate 16, height 5\' 11"  (1.803 m), weight 99.8 kg, last menstrual period 07/16/2020, SpO2 100 %. General: alert, cooperative and no distress Lochia:normal flow Chest: CTAB Heart: RRR no m/r/g Abdomen: +BS, soft, nontender Uterine Fundus: non-palpable c/w fibroid removal. Honeycomb dressing intact, neg drainage Extremities: neg edema, neg calf TTP BL, neg Homans BL  Disposition: Discharge disposition: 01-Home or Self Care       Discharge Instructions    Call MD for:  difficulty breathing, headache or visual disturbances   Complete by: As directed    Call MD for:  extreme fatigue   Complete by: As directed    Call MD for:  hives   Complete by: As directed    Call MD for:  persistant dizziness or light-headedness   Complete by: As directed    Call MD for:  persistant nausea and vomiting   Complete by: As directed    Call MD for:  redness, tenderness, or signs of infection (pain, swelling, redness, odor or green/yellow discharge around incision site)   Complete by: As directed    Call MD for:  severe uncontrolled pain   Complete by: As directed    Call MD for:  temperature >100.4   Complete by: As directed    Diet - low sodium heart healthy   Complete by: As  directed    Increase activity slowly   Complete by: As directed    Leave dressing on - Keep it clean, dry, and intact until clinic visit   Complete by: As directed    Lifting restrictions   Complete by: As directed    15lbs for 6wks   Sexual Activity Restrictions   Complete by: As directed    None for 6wks     Allergies as of 07/22/2020   No Known Allergies     Medication List    TAKE these medications   acetaminophen 500 MG tablet Commonly known as: TYLENOL Take 2 tablets (1,000 mg total) by mouth every 8 (eight) hours.   ibuprofen 800 MG tablet Commonly known as: ADVIL Take 1 tablet (800 mg total) by mouth 3 (three) times daily.   ketoconazole 2 % cream Commonly known as: NIZORAL Apply 1 application topically daily as needed for irritation.   ketoconazole 2 % shampoo Commonly known as: NIZORAL Apply 1 application topically once a week.   multivitamin with minerals tablet Take 1 tablet by mouth daily.   oxyCODONE 5 MG immediate release tablet Commonly known as: Oxy IR/ROXICODONE Take 1 tablet (5 mg total) by mouth every 6 (six) hours as needed for severe pain.   polyethylene glycol 17 g packet Commonly known as: MIRALAX / GLYCOLAX Take 17 g by mouth daily as needed for mild constipation.   PRESCRIPTION MEDICATION Apply 1 application topically every other day. Ketoconazole Oil   simethicone 80 MG chewable tablet Commonly known as: MYLICON Chew 1 tablet (  80 mg total) by mouth 4 (four) times daily as needed for flatulence.   tretinoin 0.1 % cream Commonly known as: RETIN-A Apply 1 application topically at bedtime.            Discharge Care Instructions  (From admission, onward)         Start     Ordered   07/22/20 0000  Leave dressing on - Keep it clean, dry, and intact until clinic visit        07/22/20 0738          Follow-up Information    Lavonna Lampron, Melida Quitter, MD. Go in 2 week(s).   Specialty: Obstetrics and Gynecology Why: Incision check  in 2 weeks, postop visit in 6 weeks Contact information: [address] [place] [address] [phone]               Signed: [place] 07/22/2020, 7:38 AM

## 2020-07-22 NOTE — Progress Notes (Signed)
POD #2  Subjective:  No acute events overnight.  Pt denies problems with voiding, ambulating or po intake.  She denies nausea or vomiting.  Pain is well controlled.  She has had flatus. She has not had bowel movement. Lochia Minimal. Ready for discharge home  Objective: Blood pressure 108/82, pulse 74, temperature 97.8 F (36.6 C), temperature source Oral, resp. rate 16, height 5\' 11"  (1.803 m), weight 99.8 kg, last menstrual period 07/16/2020, SpO2 100 %.  Physical Exam:  General: alert, cooperative and no distress Lochia:normal flow Chest: CTAB Heart: RRR no m/r/g Abdomen: +BS, soft, nontender Uterine Fundus: non-palpable c/w fibroid removal. Honeycomb dressing intact, neg drainage Extremities: neg edema, neg calf TTP BL, neg Homans BL  Recent Labs    07/20/20 0610 07/21/20 0109  HGB 10.1* 10.0*  HCT 32.7* 30.3*    Assessment/Plan:  ASSESSMENT: Felicia Glass is a 40 y.o. G1P1001 s/p abdominal myomectomy for symptomatic uterine fibroids/desired future fertility. PMHx s/f lap chole.   -Continue PO pain meds -Spontaneously voiding -Encourage ambulation and incentive spirometry -DC home today  Incision check in 2 wks, postop visit in 6wks   LOS: 2 days

## 2020-07-22 NOTE — Progress Notes (Signed)
Pt discharged home in stable condition 

## 2020-07-23 LAB — BPAM RBC
Blood Product Expiration Date: 202206252359
ISSUE DATE / TIME: 202205230841
Unit Type and Rh: 5100

## 2020-07-23 LAB — TYPE AND SCREEN
ABO/RH(D): O POS
Antibody Screen: NEGATIVE
Unit division: 0

## 2020-12-15 DIAGNOSIS — Z3009 Encounter for other general counseling and advice on contraception: Secondary | ICD-10-CM | POA: Diagnosis not present

## 2021-01-07 DIAGNOSIS — L700 Acne vulgaris: Secondary | ICD-10-CM | POA: Diagnosis not present

## 2021-02-02 DIAGNOSIS — D259 Leiomyoma of uterus, unspecified: Secondary | ICD-10-CM | POA: Diagnosis not present

## 2021-02-02 DIAGNOSIS — Z30430 Encounter for insertion of intrauterine contraceptive device: Secondary | ICD-10-CM | POA: Diagnosis not present

## 2021-10-20 IMAGING — CR DG CHEST 2V
2 series · 2 of 2 positions shown · non-contrast
Comparison: None.

CLINICAL DATA: History of prior QTJH4-PR positivity with persistent
chest wall pain, initial encounter

EXAM:
CHEST - 2 VIEW

[w chest pa]
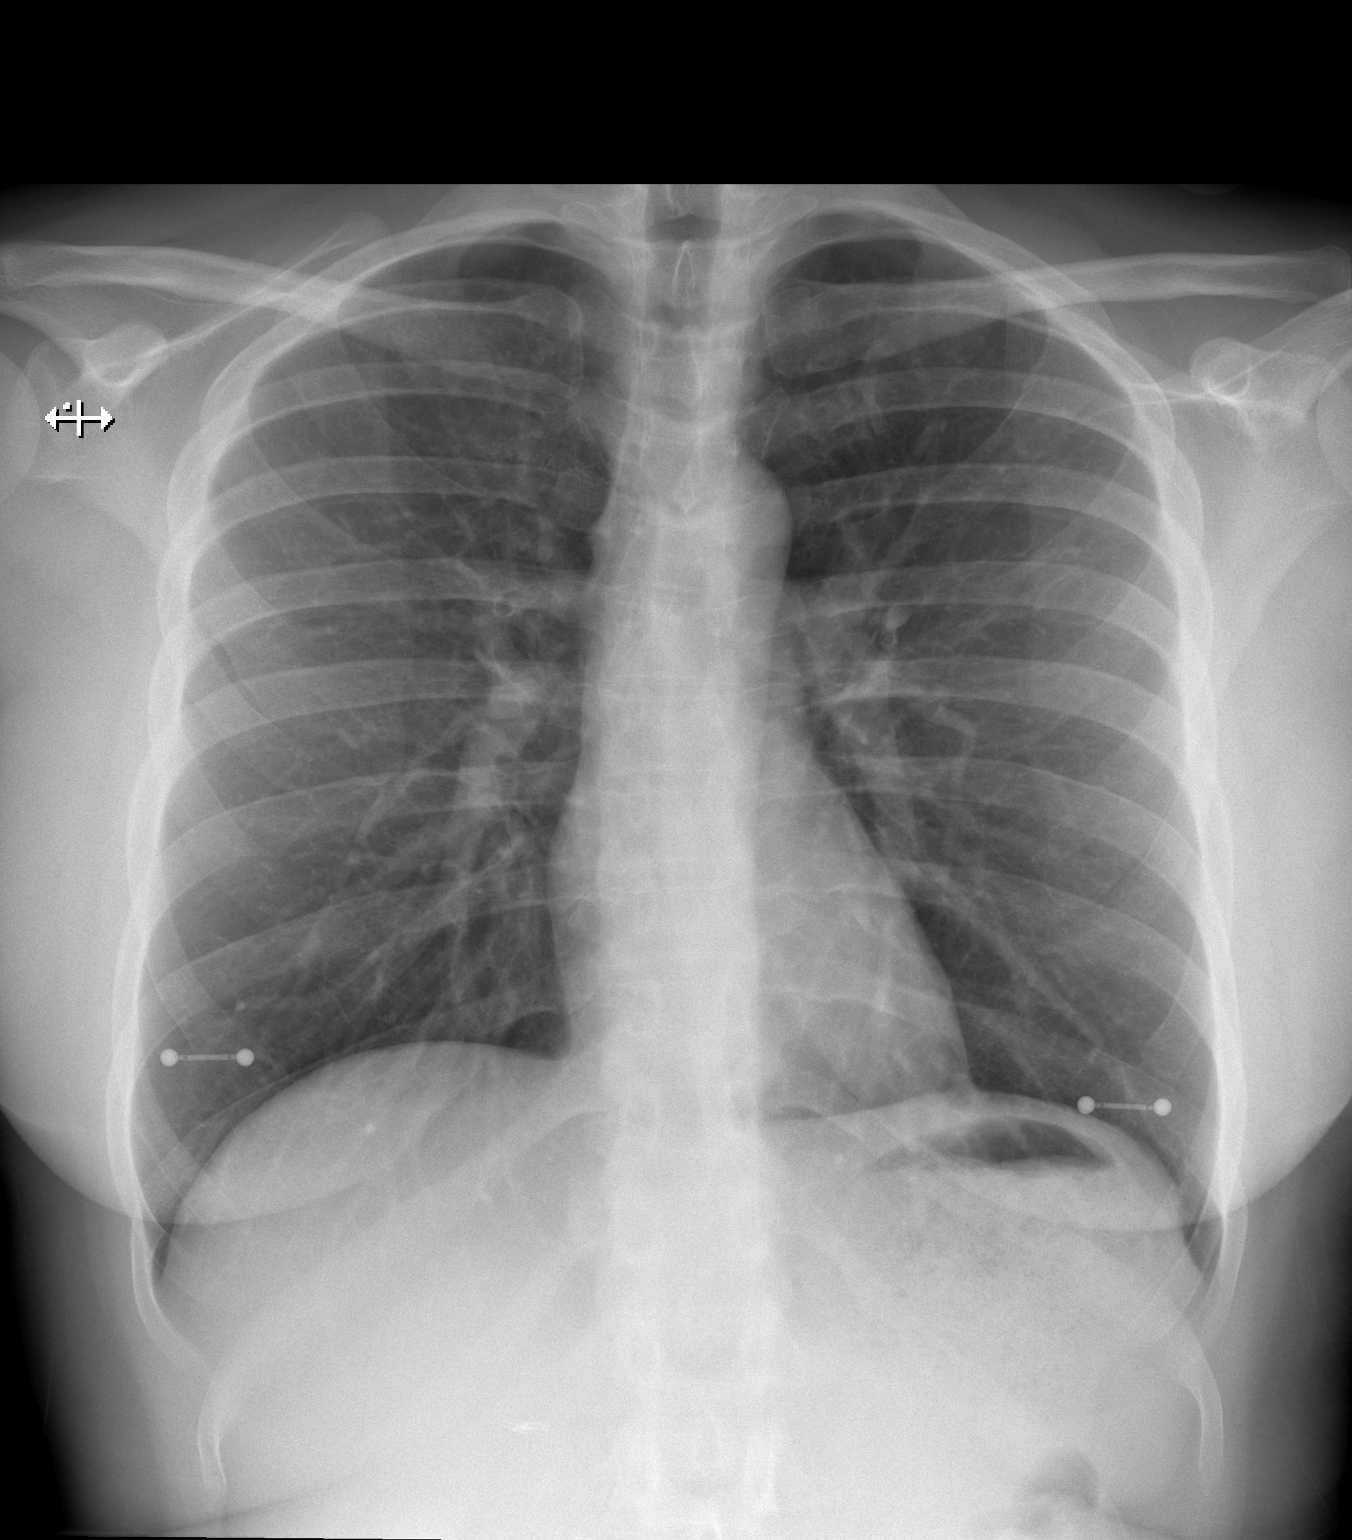

[w chest lat]
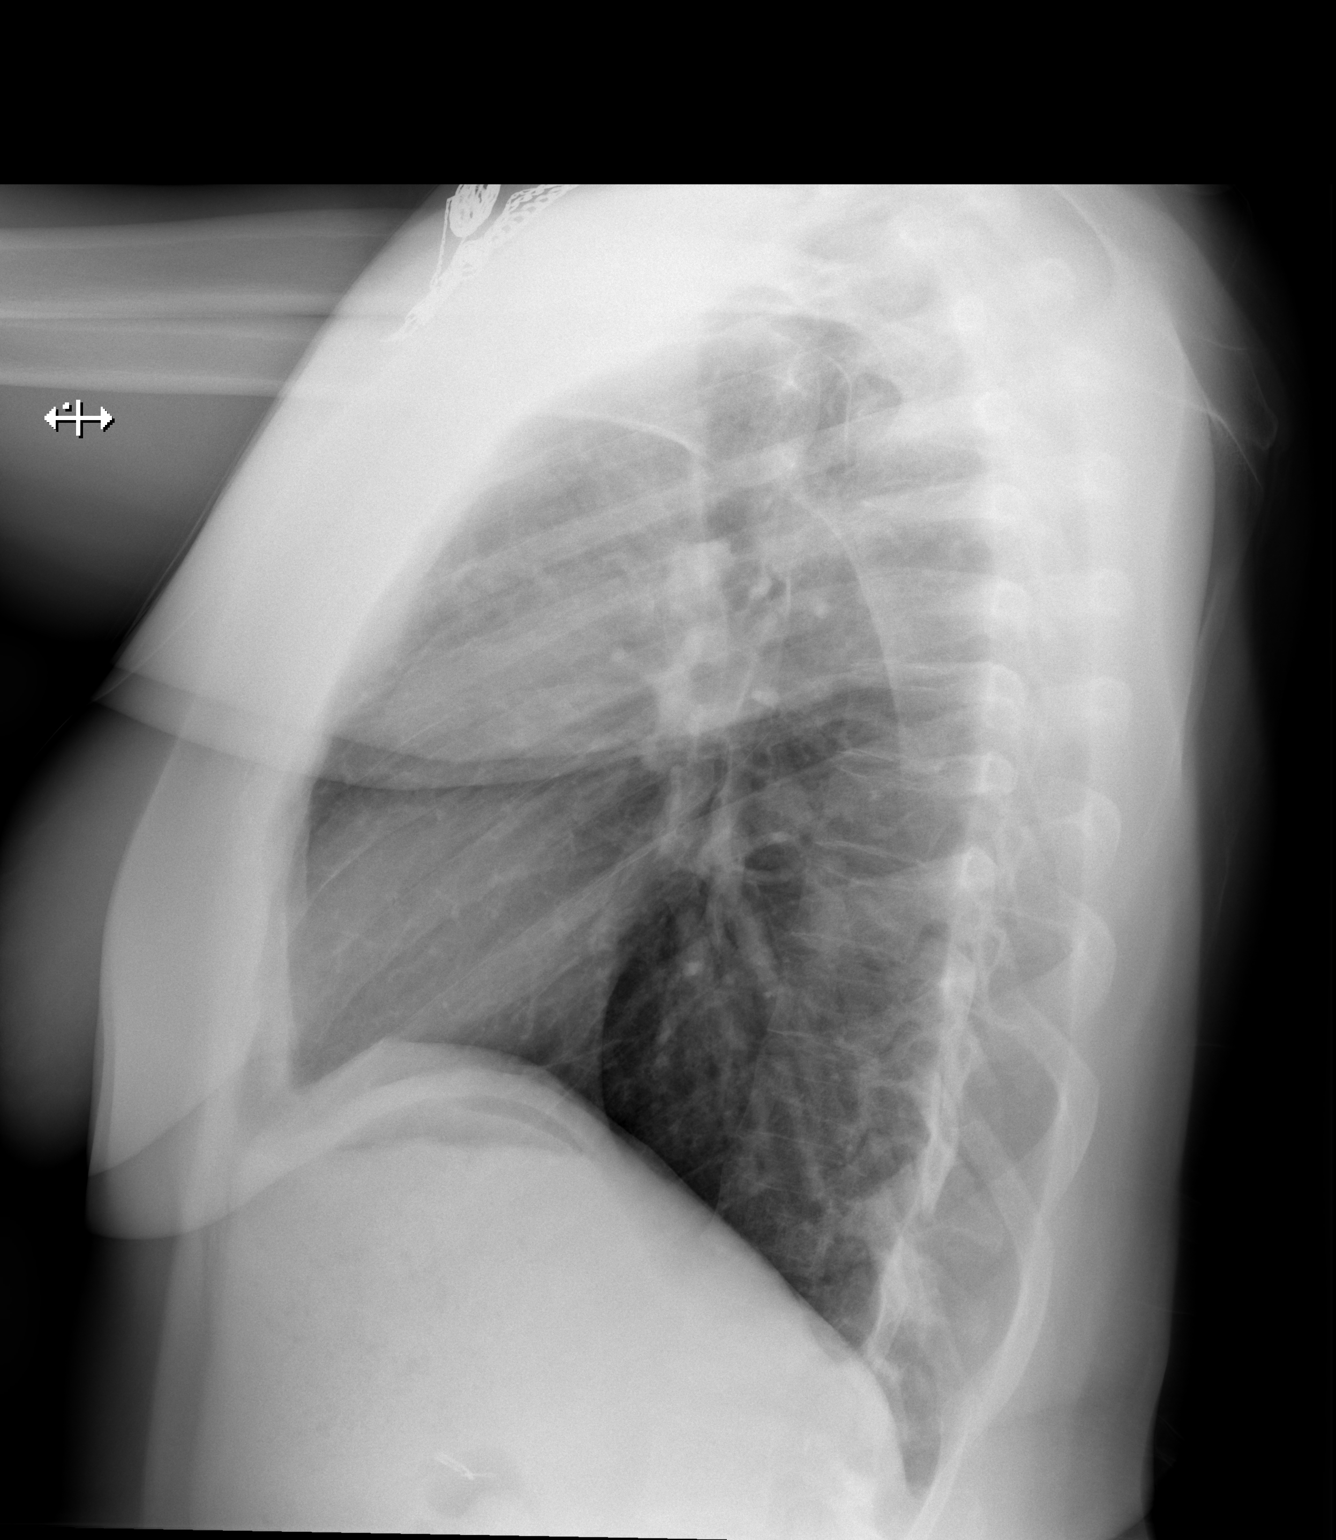

[2 of 2 positions shown; findings below may reference images not displayed]

FINDINGS: The heart size and mediastinal contours are within normal limits.
Both lungs are clear. The visualized skeletal structures are
unremarkable.
IMPRESSION: No active cardiopulmonary disease.
